# Patient Record
Sex: Female | Born: 1970 | Race: Black or African American | Hispanic: No | Marital: Single | Smoking: Never smoker
Health system: Southern US, Community
[De-identification: ages and names within clinical notes are randomized; demographics above are authoritative.]

## PROBLEM LIST (undated history)

## (undated) DIAGNOSIS — T7840XA Allergy, unspecified, initial encounter: Secondary | ICD-10-CM

## (undated) DIAGNOSIS — F329 Major depressive disorder, single episode, unspecified: Secondary | ICD-10-CM

## (undated) DIAGNOSIS — F419 Anxiety disorder, unspecified: Secondary | ICD-10-CM

## (undated) DIAGNOSIS — E7800 Pure hypercholesterolemia, unspecified: Secondary | ICD-10-CM

## (undated) DIAGNOSIS — F32A Depression, unspecified: Secondary | ICD-10-CM

## (undated) DIAGNOSIS — M797 Fibromyalgia: Secondary | ICD-10-CM

## (undated) DIAGNOSIS — I10 Essential (primary) hypertension: Secondary | ICD-10-CM

## (undated) DIAGNOSIS — F410 Panic disorder [episodic paroxysmal anxiety] without agoraphobia: Secondary | ICD-10-CM

## (undated) HISTORY — DX: Depression, unspecified: F32.A

## (undated) HISTORY — DX: Allergy, unspecified, initial encounter: T78.40XA

## (undated) HISTORY — DX: Major depressive disorder, single episode, unspecified: F32.9

## (undated) HISTORY — PX: DENTAL SURGERY: SHX609

## (undated) HISTORY — PX: ABDOMINAL SURGERY: SHX537

## (undated) HISTORY — PX: OTHER SURGICAL HISTORY: SHX169

---

## 2015-09-28 ENCOUNTER — Emergency Department (HOSPITAL_COMMUNITY): Payer: BLUE CROSS/BLUE SHIELD

## 2015-09-28 ENCOUNTER — Emergency Department (HOSPITAL_COMMUNITY)
Admission: EM | Admit: 2015-09-28 | Discharge: 2015-09-28 | Disposition: A | Discharge status: Home / Self Care | Payer: BLUE CROSS/BLUE SHIELD | Attending: Emergency Medicine | Admitting: Emergency Medicine

## 2015-09-28 ENCOUNTER — Encounter (HOSPITAL_COMMUNITY): Payer: Self-pay | Admitting: Cardiology

## 2015-09-28 DIAGNOSIS — Z79899 Other long term (current) drug therapy: Secondary | ICD-10-CM | POA: Diagnosis not present

## 2015-09-28 DIAGNOSIS — Y9389 Activity, other specified: Secondary | ICD-10-CM | POA: Insufficient documentation

## 2015-09-28 DIAGNOSIS — S3992XA Unspecified injury of lower back, initial encounter: Secondary | ICD-10-CM | POA: Diagnosis present

## 2015-09-28 DIAGNOSIS — Y92410 Unspecified street and highway as the place of occurrence of the external cause: Secondary | ICD-10-CM | POA: Insufficient documentation

## 2015-09-28 DIAGNOSIS — I10 Essential (primary) hypertension: Secondary | ICD-10-CM | POA: Diagnosis not present

## 2015-09-28 DIAGNOSIS — S32020A Wedge compression fracture of second lumbar vertebra, initial encounter for closed fracture: Secondary | ICD-10-CM | POA: Diagnosis not present

## 2015-09-28 DIAGNOSIS — Y999 Unspecified external cause status: Secondary | ICD-10-CM | POA: Diagnosis not present

## 2015-09-28 DIAGNOSIS — S32000A Wedge compression fracture of unspecified lumbar vertebra, initial encounter for closed fracture: Secondary | ICD-10-CM

## 2015-09-28 HISTORY — DX: Fibromyalgia: M79.7

## 2015-09-28 HISTORY — DX: Pure hypercholesterolemia, unspecified: E78.00

## 2015-09-28 HISTORY — DX: Anxiety disorder, unspecified: F41.9

## 2015-09-28 HISTORY — DX: Essential (primary) hypertension: I10

## 2015-09-28 HISTORY — DX: Panic disorder (episodic paroxysmal anxiety): F41.0

## 2015-09-28 LAB — I-STAT BETA HCG BLOOD, ED (MC, WL, AP ONLY)

## 2015-09-28 MED ORDER — KETOROLAC TROMETHAMINE 30 MG/ML IJ SOLN
30.0000 mg | Freq: Once | INTRAMUSCULAR | Status: AC
  Administered 2015-09-28: 30 mg via INTRAMUSCULAR
  Filled 2015-09-28: qty 1

## 2015-09-28 MED ORDER — OXYCODONE-ACETAMINOPHEN 5-325 MG PO TABS: 1.0000 | ORAL_TABLET | ORAL | 0 refills | Status: DC | PRN

## 2015-09-28 MED ORDER — OXYCODONE-ACETAMINOPHEN 5-325 MG PO TABS: 2.0000 | ORAL_TABLET | ORAL | 0 refills | Status: DC | PRN

## 2015-09-28 MED ORDER — DIAZEPAM 5 MG PO TABS
5.0000 mg | ORAL_TABLET | Freq: Once | ORAL | Status: AC
  Administered 2015-09-28: 5 mg via ORAL
  Filled 2015-09-28: qty 1

## 2015-09-28 MED ORDER — MORPHINE SULFATE (PF) 4 MG/ML IV SOLN
4.0000 mg | Freq: Once | INTRAVENOUS | Status: AC
  Administered 2015-09-28: 4 mg via INTRAVENOUS
  Filled 2015-09-28: qty 1

## 2015-09-28 NOTE — ED Notes (Signed)
Back brace placed on pt by bio tech, pt tolerated well, cms remains intact all extremities,

## 2015-09-28 NOTE — ED Notes (Signed)
Biomed tech from [facility] at bedside,

## 2015-09-28 NOTE — ED Notes (Signed)
Pt updated on delay,  
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
## 2015-09-28 NOTE — ED Notes (Signed)
Pt given one pre pack of percocet, qty of 6,
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
## 2015-10-08 MED FILL — Oxycodone w/ Acetaminophen Tab 5-325 MG: ORAL | Qty: 6 | Status: AC

## 2016-02-01 ENCOUNTER — Ambulatory Visit: Payer: BLUE CROSS/BLUE SHIELD | Attending: Neurosurgery

## 2016-02-01 DIAGNOSIS — G8929 Other chronic pain: Secondary | ICD-10-CM | POA: Diagnosis present

## 2016-02-01 DIAGNOSIS — M545 Low back pain, unspecified: Secondary | ICD-10-CM

## 2016-02-01 NOTE — Patient Instructions (Signed)
   You can perform this exercise in any position.   Gently pull your belly button in.    Hold for 5 seconds.    Perform throughout the day (about 2 sets of 10 every 2 hours)
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
## 2016-02-07 ENCOUNTER — Ambulatory Visit: Payer: BLUE CROSS/BLUE SHIELD

## 2016-02-07 DIAGNOSIS — G8929 Other chronic pain: Secondary | ICD-10-CM

## 2016-02-07 DIAGNOSIS — M545 Low back pain: Principal | ICD-10-CM
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
## 2016-02-14 ENCOUNTER — Ambulatory Visit: Payer: BLUE CROSS/BLUE SHIELD

## 2016-02-14 DIAGNOSIS — M545 Low back pain, unspecified: Secondary | ICD-10-CM

## 2016-02-14 DIAGNOSIS — G8929 Other chronic pain: Secondary | ICD-10-CM
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
## 2016-02-16 ENCOUNTER — Ambulatory Visit: Payer: BLUE CROSS/BLUE SHIELD

## 2016-02-16 DIAGNOSIS — G8929 Other chronic pain: Secondary | ICD-10-CM

## 2016-02-16 DIAGNOSIS — M545 Low back pain: Secondary | ICD-10-CM | POA: Diagnosis not present

## 2016-02-16 NOTE — Patient Instructions (Signed)
Gave supine hip fallouts 10x3 with 5 seconds for 3x/day 5 days a week (starting off with 5x2 daily) as part of her HEP. Pt demonstrated and verbalized understanding. Handout provided.
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
## 2016-03-01 ENCOUNTER — Ambulatory Visit: Payer: BLUE CROSS/BLUE SHIELD

## 2016-03-02 ENCOUNTER — Ambulatory Visit: Payer: BLUE CROSS/BLUE SHIELD

## 2016-03-06 ENCOUNTER — Ambulatory Visit: Payer: BLUE CROSS/BLUE SHIELD | Attending: Neurosurgery

## 2016-03-06 DIAGNOSIS — G8929 Other chronic pain: Secondary | ICD-10-CM | POA: Diagnosis present

## 2016-03-06 DIAGNOSIS — M545 Low back pain: Secondary | ICD-10-CM | POA: Insufficient documentation
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
## 2016-03-08 ENCOUNTER — Ambulatory Visit: Payer: BLUE CROSS/BLUE SHIELD

## 2016-03-08 DIAGNOSIS — M545 Low back pain: Secondary | ICD-10-CM | POA: Diagnosis not present

## 2016-03-08 DIAGNOSIS — G8929 Other chronic pain: Secondary | ICD-10-CM

## 2016-03-08 NOTE — Patient Instructions (Signed)
Strengthening: Resisted Extension    Tighten abdomen and pelvic floor muscles.   Hold yellow band, one in each hand, arms forward. Pull arms back, elbow straight. Hold for 5 seconds Repeat ___10_ times per set. Do _3___ sets per session. Do _1__ sessions per day.  http://orth.exer.us/832   Copyright  V[employer]. All rights reserved.
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
## 2016-03-13 ENCOUNTER — Ambulatory Visit: Payer: BLUE CROSS/BLUE SHIELD

## 2016-03-14 ENCOUNTER — Ambulatory Visit: Payer: BLUE CROSS/BLUE SHIELD

## 2016-03-14 DIAGNOSIS — G8929 Other chronic pain: Secondary | ICD-10-CM

## 2016-03-14 DIAGNOSIS — M545 Low back pain: Secondary | ICD-10-CM | POA: Diagnosis not present

## 2016-03-14 NOTE — Patient Instructions (Addendum)
Gave seated L hip extension with L foot propped on a 3-4 inch step 10x3 with 5 second holds daily as part of her HEP. Pt demonstrated and verbalized understanding. Handout provided.
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
## 2016-03-16 ENCOUNTER — Ambulatory Visit: Payer: BLUE CROSS/BLUE SHIELD

## 2016-03-21 ENCOUNTER — Ambulatory Visit: Payer: BLUE CROSS/BLUE SHIELD

## 2016-03-23 ENCOUNTER — Ambulatory Visit: Payer: BLUE CROSS/BLUE SHIELD

## 2016-03-23 DIAGNOSIS — G8929 Other chronic pain: Secondary | ICD-10-CM

## 2016-03-23 DIAGNOSIS — M545 Low back pain: Principal | ICD-10-CM

## 2016-03-23 NOTE — Patient Instructions (Addendum)
Flexion: Isometric (Sitting)    Sitting on a chair     Place your right hand on your right knee   Lift your right knee up (but do not let it move with your right hand)     Hold for 5 seconds.     Repeat 10 times     Do 3 sets daily.         Scapular Retraction: Rowing (Eccentric) - Arms - 45 Degrees (Resistance Band)   Perform in sitting  Hold end of red band in each hand. Pull back until elbows are even with trunk.  Squeeze your shoulder blades together.   Hold for 5 seconds.  Use ______red__ resistance band. _10__ reps per set, _3__ sets per day. Copyright  [employer]. All rights reserved.
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
## 2016-03-28 ENCOUNTER — Ambulatory Visit: Payer: BLUE CROSS/BLUE SHIELD

## 2016-03-30 ENCOUNTER — Ambulatory Visit: Payer: BLUE CROSS/BLUE SHIELD | Attending: Neurosurgery

## 2016-03-30 DIAGNOSIS — G8929 Other chronic pain: Secondary | ICD-10-CM | POA: Diagnosis present

## 2016-03-30 DIAGNOSIS — M545 Low back pain: Secondary | ICD-10-CM | POA: Insufficient documentation
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
## 2016-04-04 ENCOUNTER — Ambulatory Visit: Payer: BLUE CROSS/BLUE SHIELD

## 2016-04-04 DIAGNOSIS — M545 Low back pain, unspecified: Secondary | ICD-10-CM

## 2016-04-04 DIAGNOSIS — G8929 Other chronic pain: Secondary | ICD-10-CM
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
## 2016-04-04 NOTE — Patient Instructions (Addendum)
  Sitting on a chair: raise your right knee (only) up.    Repeat 10 times.   Perform 3 sets daily.         Holding onto something sturdy for support:   Step up onto the first stair step with your right foot.     You are going to end up on top of the first step   Step off with the left foot.    Repeat 10 times.    Perform 3 sets daily.

## 2016-04-06 ENCOUNTER — Ambulatory Visit: Payer: BLUE CROSS/BLUE SHIELD

## 2016-04-06 DIAGNOSIS — G8929 Other chronic pain: Secondary | ICD-10-CM

## 2016-04-06 DIAGNOSIS — M545 Low back pain: Secondary | ICD-10-CM | POA: Diagnosis not present

## 2016-04-06 NOTE — Patient Instructions (Addendum)
  Scapular Retraction: Rowing (Eccentric) - Arms - 45 Degrees (Resistance Band)   Hold end of band in each hand. Pull back until elbows are even with trunk.  Hold for 5 seconds.   Use _yellow or red_______ resistance band. _10__ reps per set, _3__ sets per day. Copyright  VHI. All rights reserved.        Pt was recommended to hold off on the seated R hip flexion isometrics HEP. Pt demonstrated and verbalized understanding.
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
## 2016-04-11 ENCOUNTER — Ambulatory Visit: Payer: BLUE CROSS/BLUE SHIELD

## 2016-04-13 ENCOUNTER — Ambulatory Visit: Payer: BLUE CROSS/BLUE SHIELD

## 2016-04-13 DIAGNOSIS — M545 Low back pain: Principal | ICD-10-CM

## 2016-04-13 DIAGNOSIS — G8929 Other chronic pain: Secondary | ICD-10-CM

## 2016-04-13 NOTE — Patient Instructions (Signed)
Upper Cervical Flexion / Extension    Gently flex and extend upper neck by nodding head. Try to make a "long neck" or a "double chin." Hold ___5_ seconds. Repeat _10___ times per set. Do 3____ sets per session. Do ___1_ sessions per day.   Can also do this exercise while working.   http://orth.exer.us/351   Copyright  VHI. All rights reserved.
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
## 2016-04-18 ENCOUNTER — Ambulatory Visit: Payer: BLUE CROSS/BLUE SHIELD

## 2016-04-18 DIAGNOSIS — G8929 Other chronic pain: Secondary | ICD-10-CM

## 2016-04-18 DIAGNOSIS — M545 Low back pain, unspecified: Secondary | ICD-10-CM
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
## 2016-04-20 ENCOUNTER — Ambulatory Visit: Payer: BLUE CROSS/BLUE SHIELD

## 2016-04-20 DIAGNOSIS — G8929 Other chronic pain: Secondary | ICD-10-CM

## 2016-04-20 DIAGNOSIS — M545 Low back pain: Principal | ICD-10-CM

## 2016-04-20 NOTE — Patient Instructions (Signed)
  Walk side ways from one end of your hallway to the other.    Perform until tired daily.
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
## 2016-09-17 ENCOUNTER — Emergency Department: Payer: BLUE CROSS/BLUE SHIELD

## 2016-09-17 ENCOUNTER — Encounter: Payer: Self-pay | Admitting: Emergency Medicine

## 2016-09-17 DIAGNOSIS — E876 Hypokalemia: Secondary | ICD-10-CM | POA: Insufficient documentation

## 2016-09-17 DIAGNOSIS — R002 Palpitations: Secondary | ICD-10-CM | POA: Insufficient documentation

## 2016-09-17 DIAGNOSIS — D509 Iron deficiency anemia, unspecified: Secondary | ICD-10-CM | POA: Insufficient documentation

## 2016-09-17 DIAGNOSIS — I10 Essential (primary) hypertension: Secondary | ICD-10-CM | POA: Diagnosis not present

## 2016-09-17 DIAGNOSIS — Z79899 Other long term (current) drug therapy: Secondary | ICD-10-CM | POA: Insufficient documentation

## 2016-09-17 DIAGNOSIS — R42 Dizziness and giddiness: Secondary | ICD-10-CM | POA: Diagnosis present

## 2016-09-17 LAB — BASIC METABOLIC PANEL WITH GFR
Anion gap: 8 (ref 5–15)
BUN: 9 mg/dL (ref 6–20)
CO2: 29 mmol/L (ref 22–32)
Calcium: 9 mg/dL (ref 8.9–10.3)
Chloride: 100 mmol/L — ABNORMAL LOW (ref 101–111)
Creatinine, Ser: 0.77 mg/dL (ref 0.44–1.00)
GFR calc Af Amer: >60 mL/min
GFR calc non Af Amer: >60 mL/min
Glucose, Bld: 118 mg/dL — ABNORMAL HIGH (ref 65–99)
Potassium: 2.9 mmol/L — ABNORMAL LOW (ref 3.5–5.1)
Sodium: 137 mmol/L (ref 135–145)

## 2016-09-17 LAB — CBC
HCT: 26 % — ABNORMAL LOW (ref 35.0–47.0)
HEMOGLOBIN: 8.3 g/dL — AB (ref 12.0–16.0)
MCH: 19.1 pg — AB (ref 26.0–34.0)
MCHC: 32 g/dL (ref 32.0–36.0)
MCV: 59.8 fL — AB (ref 80.0–100.0)
PLATELETS: 403 10*3/uL (ref 150–440)
RBC: 4.34 MIL/uL (ref 3.80–5.20)
RDW: 20.8 % — ABNORMAL HIGH (ref 11.5–14.5)
WBC: 6.1 10*3/uL (ref 3.6–11.0)

## 2016-09-17 NOTE — ED Triage Notes (Signed)
Pt reports for several months intermittent palpitations with burning sensation in her chest; last episode was "the other day"; pt also reports intermittent throbbing sensation to various places in her head, last episode was "sometime this week"; pt also c/o feeling lightheaded intermittently, last episode was "this week"; pt also c/o numbness to her right elbow and numbness to bilateral hands when she wakes "every morning"; denies cough, fever, weakness; pt with flat affect in triage; answering questions in complete coherent sentences

## 2016-09-18 ENCOUNTER — Emergency Department
Admission: EM | Admit: 2016-09-18 | Discharge: 2016-09-18 | Disposition: A | Discharge status: Home / Self Care | Payer: BLUE CROSS/BLUE SHIELD | Attending: Emergency Medicine | Admitting: Emergency Medicine

## 2016-09-18 DIAGNOSIS — E876 Hypokalemia: Secondary | ICD-10-CM

## 2016-09-18 DIAGNOSIS — D509 Iron deficiency anemia, unspecified: Secondary | ICD-10-CM

## 2016-09-18 LAB — TROPONIN I: Troponin I: <0.03 ng/mL (ref <0.03)

## 2016-09-18 MED ORDER — FERROUS SULFATE 325 (65 FE) MG PO TABS: 325.0000 mg | ORAL_TABLET | Freq: Every day | ORAL | 3 refills | Status: AC

## 2016-09-18 MED ORDER — POTASSIUM CHLORIDE 20 MEQ PO PACK
40.0000 meq | PACK | Freq: Once | ORAL | Status: AC
  Administered 2016-09-18: 40 meq via ORAL
  Filled 2016-09-18: qty 2

## 2016-09-18 NOTE — ED Provider Notes (Signed)
[facility] Emergency Department Provider Note    First MD Initiated Contact with Patient 09/18/16 0206     (approximate)  I have reviewed the triage vital signs and the nursing notes.   HISTORY  Chief Complaint Palpitations and Dizziness    HPI Stacie Stewart is a 46 y.o. female with low-dose of chronic medical conditions presents to emergency department with history of heart palpitations times "couple years", fatigue, intermittent lightheadedness. Patient denies any headache no nausea vomiting. Patient denies any abdominal pain chest pain or shortness of breath. Patient denies any fever. Patient does however admit to heavy menstrual periods. No previous history of anemia or thyroid disease.   Past Medical History:  Diagnosis Date  . Allergy   . Anxiety   . Depression   . Fibromyalgia   . Hypercholesteremia   . Hypertension   . Panic attacks     There are no active problems to display for this patient.   Past Surgical History:  Procedure Laterality Date  . ABDOMINAL SURGERY    . DENTAL SURGERY    . thyroid     pt has not had thyroid surgery    Prior to Admission medications   Medication Sig Start Date End Date Taking? Authorizing Provider  cetirizine (ZYRTEC) 10 MG tablet Take 10 mg by mouth daily as needed for allergies.   Yes [provider]  cholecalciferol (VITAMIN D) 1000 units tablet Take 1,000 Units by mouth daily.   Yes [provider]  cyanocobalamin 500 MCG tablet Take 500 mcg by mouth daily.   Yes [provider]  diltiazem (CARDIZEM) 120 MG tablet Take 120 mg by mouth daily.   Yes [provider]  DULoxetine (CYMBALTA) 30 MG capsule Take by mouth daily.    Yes [provider]  Omega-3 Fatty Acids (FISH OIL) 1000 MG CAPS Take 1 capsule by mouth daily.   Yes [provider]  triamterene-hydrochlorothiazide (MAXZIDE-25) 37.5-25 MG tablet Take 1 tablet by mouth daily.   Yes  [provider]  ferrous sulfate 325 (65 FE) MG tablet Take 1 tablet (325 mg total) by mouth daily. 09/18/16 09/18/17  Gregor Hams, MD    Allergies No known drug allergies History reviewed. No pertinent family history.  Social History Social History  Substance Use Topics  . Smoking status: Never Smoker  . Smokeless tobacco: Never Used  . Alcohol use No    Review of Systems Constitutional: No fever/chills Eyes: No visual changes. ENT: No sore throat. Cardiovascular: Denies chest pain. Positive palpitation Respiratory: Denies shortness of breath. Gastrointestinal: No abdominal pain.  No nausea, no vomiting.  No diarrhea.  No constipation. Genitourinary: Negative for dysuria. Musculoskeletal: Negative for neck pain.  Negative for back pain. Integumentary: Negative for rash. Neurological: Negative for headaches, focal weakness or numbness. Positive for fatigue   ____________________________________________   PHYSICAL EXAM:  VITAL SIGNS: ED Triage Vitals  Enc Vitals Group     BP 09/17/16 2319 (!) 144/91     Pulse Rate 09/17/16 2319 (!) 103     Resp 09/17/16 2319 18     Temp 09/17/16 2319 99.7 F (37.6 C)     Temp Source 09/17/16 2319 Oral     SpO2 09/17/16 2319 100 %     Weight 09/17/16 2320 81.6 kg (180 lb)     Height 09/17/16 2320 1.6 m (5\' 3" )     Head Circumference --      Peak Flow --  Pain Score 09/17/16 2321 0     Pain Loc --      Pain Edu? --      Excl. in [place]? --     Constitutional: Alert and oriented. Well appearing and in no acute distress. Eyes: Conjunctivae are Pale Head: Atraumatic. Mouth/Throat: Mucous membranes are moist. Oropharynx non-erythematous. Neck: No stridor.  Cardiovascular: Normal rate, regular rhythm. Good peripheral circulation. Grossly normal heart sounds. Respiratory: Normal respiratory effort.  No retractions. Lungs CTAB. Gastrointestinal: Soft and nontender. No distention.  Musculoskeletal: No lower extremity  tenderness nor edema. No gross deformities of extremities. Neurologic:  Normal speech and language. No gross focal neurologic deficits are appreciated.  Skin:  Skin is warm, dry and intact. No rash noted. Psychiatric: Mood and affect are normal. Speech and behavior are normal.  ____________________________________________   LABS (all labs ordered are listed, but only abnormal results are displayed)  Labs Reviewed  BASIC METABOLIC PANEL - Abnormal; Notable for the following:       Result Value   Potassium 2.9 (*)    Chloride 100 (*)    Glucose, Bld 118 (*)    All other components within normal limits  CBC - Abnormal; Notable for the following:    Hemoglobin 8.3 (*)    HCT 26.0 (*)    MCV 59.8 (*)    MCH 19.1 (*)    RDW 20.8 (*)    All other components within normal limits  TROPONIN I  URINALYSIS, COMPLETE (UACMP) WITH MICROSCOPIC    RADIOLOGY I, Tracy N Amijah Timothy, personally viewed and evaluated these images (plain radiographs) as part of my medical decision making, as well as reviewing the written report by the radiologist.  Dg Chest 2 View  Result Date: 09/18/2016 CLINICAL DATA:  Palpitations EXAM: CHEST  2 VIEW COMPARISON:  None. FINDINGS: The heart size and mediastinal contours are within normal limits. Both lungs are clear. The visualized skeletal structures are unremarkable. IMPRESSION: No active cardiopulmonary disease. Electronically Signed   By: Ulyses Jarred M.D.   On: 09/18/2016 00:35   ED ECG REPORT I,  N Kiyah Demartini, the attending physician, personally viewed and interpreted this ECG.   Date: 09/18/2016  EKG Time: 11:23 PM  Rate: 89  Rhythm: Normal sinus rhythm  Axis: Normal  Intervals: Normal  ST&T Change: None  Procedures   ____________________________________________   INITIAL IMPRESSION / ASSESSMENT AND PLAN / ED COURSE  Pertinent labs & imaging results that were available during my care of the patient were reviewed by me and considered in my  medical decision making (see chart for details).  46 year old female presenting with fatigue, intermittent heart palpitations and lightheadedness. Clinical exam unremarkable with the exception of pale conjunctiva. Laboratory data revealed hemoglobin of 8.3 with a hematocrit of 26 in addition patient's potassium noted to be 2.9. Patient received 40 mEq of oral potassium. Patient will be prescribed iron is a patient MCV is 59.8 . Patient is advised to follow-up with primary care provider.      ____________________________________________  FINAL CLINICAL IMPRESSION(S) / ED DIAGNOSES  Final diagnoses:  Hypokalemia  Iron deficiency anemia, unspecified iron deficiency anemia type     MEDICATIONS GIVEN DURING THIS VISIT:  Medications - No data to display   NEW OUTPATIENT MEDICATIONS STARTED DURING THIS VISIT:  New Prescriptions   FERROUS SULFATE 325 (65 FE) MG TABLET    Take 1 tablet (325 mg total) by mouth daily.    Modified Medications   No medications on file  Discontinued Medications   OXYCODONE-ACETAMINOPHEN (PERCOCET/ROXICET) 5-325 MG TABLET    Take 2 tablets by mouth every 4 (four) hours as needed for severe pain.   OXYCODONE-ACETAMINOPHEN (PERCOCET/ROXICET) 5-325 MG TABLET    Take 1 tablet by mouth every 4 (four) hours as needed for severe pain.     Note:  This document was prepared using Dragon voice recognition software and may include unintentional dictation errors.    Gregor Hams, MD 09/18/16 [phone]

## 2017-03-19 ENCOUNTER — Encounter: Payer: Self-pay | Admitting: Emergency Medicine

## 2017-03-19 ENCOUNTER — Emergency Department
Admission: EM | Admit: 2017-03-19 | Discharge: 2017-03-19 | Disposition: A | Discharge status: Home / Self Care | Payer: BLUE CROSS/BLUE SHIELD | Attending: Emergency Medicine | Admitting: Emergency Medicine

## 2017-03-19 ENCOUNTER — Other Ambulatory Visit: Payer: Self-pay

## 2017-03-19 DIAGNOSIS — I10 Essential (primary) hypertension: Secondary | ICD-10-CM | POA: Insufficient documentation

## 2017-03-19 DIAGNOSIS — M545 Low back pain, unspecified: Secondary | ICD-10-CM

## 2017-03-19 DIAGNOSIS — J302 Other seasonal allergic rhinitis: Secondary | ICD-10-CM | POA: Insufficient documentation

## 2017-03-19 DIAGNOSIS — Z79899 Other long term (current) drug therapy: Secondary | ICD-10-CM | POA: Insufficient documentation

## 2017-03-19 DIAGNOSIS — Z760 Encounter for issue of repeat prescription: Secondary | ICD-10-CM

## 2017-03-19 LAB — COMPREHENSIVE METABOLIC PANEL
ALBUMIN: 3.7 g/dL (ref 3.5–5.0)
ALT: 21 U/L (ref 14–54)
AST: 27 U/L (ref 15–41)
Alkaline Phosphatase: 44 U/L (ref 38–126)
Anion gap: 9 (ref 5–15)
BUN: 7 mg/dL (ref 6–20)
CO2: 24 mmol/L (ref 22–32)
CREATININE: 0.65 mg/dL (ref 0.44–1.00)
Calcium: 8.6 mg/dL — ABNORMAL LOW (ref 8.9–10.3)
Chloride: 106 mmol/L (ref 101–111)
GFR calc Af Amer: >60 mL/min (ref >60)
GLUCOSE: 100 mg/dL — AB (ref 65–99)
Potassium: 3.8 mmol/L (ref 3.5–5.1)
SODIUM: 139 mmol/L (ref 135–145)
Total Bilirubin: 0.5 mg/dL (ref 0.3–1.2)
Total Protein: 7 g/dL (ref 6.5–8.1)

## 2017-03-19 LAB — CBC
HCT: 34.4 % — ABNORMAL LOW (ref 35.0–47.0)
Hemoglobin: 11.2 g/dL — ABNORMAL LOW (ref 12.0–16.0)
MCH: 25.7 pg — ABNORMAL LOW (ref 26.0–34.0)
MCHC: 32.7 g/dL (ref 32.0–36.0)
MCV: 78.6 fL — AB (ref 80.0–100.0)
PLATELETS: 246 10*3/uL (ref 150–440)
RBC: 4.37 MIL/uL (ref 3.80–5.20)
RDW: 16.2 % — ABNORMAL HIGH (ref 11.5–14.5)
WBC: 5.6 10*3/uL (ref 3.6–11.0)

## 2017-03-19 LAB — URINALYSIS, COMPLETE (UACMP) WITH MICROSCOPIC
BILIRUBIN URINE: NEGATIVE
Bacteria, UA: NONE SEEN
GLUCOSE, UA: NEGATIVE mg/dL
HGB URINE DIPSTICK: NEGATIVE
Ketones, ur: NEGATIVE mg/dL
LEUKOCYTES UA: NEGATIVE
Nitrite: NEGATIVE
PH: 7 (ref 5.0–8.0)
Protein, ur: NEGATIVE mg/dL
RBC / HPF: NONE SEEN RBC/hpf (ref 0–5)
SPECIFIC GRAVITY, URINE: 1.012 (ref 1.005–1.030)

## 2017-03-19 LAB — POCT PREGNANCY, URINE: Preg Test, Ur: NEGATIVE

## 2017-03-19 MED ORDER — CYCLOBENZAPRINE HCL 5 MG PO TABS: ORAL_TABLET | ORAL | 0 refills | Status: AC

## 2017-03-19 MED ORDER — IBUPROFEN 600 MG PO TABS: 600.0000 mg | ORAL_TABLET | Freq: Four times a day (QID) | ORAL | 0 refills | Status: DC | PRN

## 2017-03-19 MED ORDER — DILTIAZEM HCL 120 MG PO TABS
120.0000 mg | ORAL_TABLET | Freq: Every day | ORAL | 0 refills | Status: AC
Start: 2017-03-19 — End: ?

## 2017-03-19 MED ORDER — CETIRIZINE HCL 10 MG PO TABS: 10.0000 mg | ORAL_TABLET | Freq: Every day | ORAL | 0 refills | Status: AC | PRN

## 2017-03-19 MED ORDER — DULOXETINE HCL 30 MG PO CPEP: 30.0000 mg | ORAL_CAPSULE | Freq: Every day | ORAL | 0 refills | Status: AC

## 2017-03-19 NOTE — ED Notes (Addendum)
See triage note  Presents with back pain for about 3 days   Denies any fall but started a new job  And thinks this is causing the pain  Describes pain as soreness also having generalized swelling    States she has a hx of this in past

## 2017-03-19 NOTE — ED Provider Notes (Signed)
[facility] Emergency Department Provider Note  ____________________________________________  Time seen: Approximately 3:25 PM  I have reviewed the triage vital signs and the nursing notes.   HISTORY  Chief Complaint Back Pain    HPI Stacie Stewart is a 47 y.o. female that presents the emergency department for evaluation of low back pain and " whole body swelling" after starting her new job 3 days ago.  Patient works at [employer] and is on her feet 9 hours/day.  She is cutting strawberries and mangoes.  She states that after work she feels like her face, hands, arms, legs are swelling. Her face and eyes are the worse. She also has rhinorrhea that started after starting her new job. She is also having pain throughout her low back.  No trauma or injury.  She also ran out of her diltiazam, cymbalta, and maxzide last week. She does not currently have a primary care provider because she does not have insurance. She has a history of anemia and takes iron supplements.  No history of thyroid problems or CHF. She denies chest pain, shortness of breath, nausea, vomiting, abdominal pain, bowel or bladder dysfunction, saddle paresthesias, dysuria, urgency, frequency, radicular symptoms.  Past Medical History:  Diagnosis Date  . Allergy   . Anxiety   . Depression   . Fibromyalgia   . Hypercholesteremia   . Hypertension   . Panic attacks     There are no active problems to display for this patient.   Past Surgical History:  Procedure Laterality Date  . ABDOMINAL SURGERY    . DENTAL SURGERY    . thyroid     pt has not had thyroid surgery    Prior to Admission medications   Medication Sig Start Date End Date Taking? Authorizing Provider  cetirizine (ZYRTEC) 10 MG tablet Take 1 tablet (10 mg total) by mouth daily as needed for allergies. 03/19/17   Laban Emperor, PA-C  cholecalciferol (VITAMIN D) 1000 units tablet Take 1,000 Units by mouth daily.    [provider]  cyanocobalamin 500 MCG tablet Take 500 mcg by mouth daily.    [provider]  cyclobenzaprine (FLEXERIL) 5 MG tablet Take 1-2 tablets 3 times daily as needed 03/19/17   Laban Emperor, PA-C  diltiazem (CARDIZEM) 120 MG tablet Take 1 tablet (120 mg total) by mouth daily. 03/19/17   Laban Emperor, PA-C  DULoxetine (CYMBALTA) 30 MG capsule Take 1 capsule (30 mg total) by mouth daily. 03/19/17   Laban Emperor, PA-C  ferrous sulfate 325 (65 FE) MG tablet Take 1 tablet (325 mg total) by mouth daily. 09/18/16 09/18/17  Gregor Hams, MD  ibuprofen (ADVIL,MOTRIN) 600 MG tablet Take 1 tablet (600 mg total) by mouth every 6 (six) hours as needed. 03/19/17   Laban Emperor, PA-C  Omega-3 Fatty Acids (FISH OIL) 1000 MG CAPS Take 1 capsule by mouth daily.    [provider]  triamterene-hydrochlorothiazide (MAXZIDE-25) 37.5-25 MG tablet Take 1 tablet by mouth daily.    [provider]    Allergies Patient has no known allergies.  No family history on file.  Social History Social History   Tobacco Use  . Smoking status: Never Smoker  . Smokeless tobacco: Never Used  Substance Use Topics  . Alcohol use: No  . Drug use: No     Review of Systems  Cardiovascular: No chest pain. Respiratory: No SOB. Gastrointestinal: No abdominal pain.  No nausea, no vomiting.  Musculoskeletal: Positive for low  back pain. Skin: Negative for rash, abrasions, lacerations, ecchymosis. Neurological: Negative for  numbness or tingling   ____________________________________________   PHYSICAL EXAM:  VITAL SIGNS: ED Triage Vitals  Enc Vitals Group     BP 03/19/17 1425 (!) 141/95     Pulse Rate 03/19/17 1425 (!) 105     Resp 03/19/17 1425 20     Temp 03/19/17 1425 98.5 F (36.9 C)     Temp Source 03/19/17 1425 Oral     SpO2 03/19/17 1425 99 %     Weight 03/19/17 1426 185 lb (83.9 kg)     Height 03/19/17 1426 5\' 3"  (1.6 m)     Head Circumference --      Peak Flow  --      Pain Score 03/19/17 1425 2     Pain Loc --      Pain Edu? --      Excl. in [place]? --      Constitutional: Alert and oriented. Well appearing and in no acute distress. Eyes: Conjunctivae are normal. PERRL. EOMI. Head: Atraumatic. ENT:      Ears:      Nose: No congestion/rhinnorhea.      Mouth/Throat: Mucous membranes are moist.  Neck: No stridor.  Cardiovascular: Normal rate, regular rhythm.  Good peripheral circulation. Respiratory: Normal respiratory effort without tachypnea or retractions. Lungs CTAB. Good air entry to the bases with no decreased or absent breath sounds. Gastrointestinal: Bowel sounds 4 quadrants. Soft and nontender to palpation. No guarding or rigidity. No palpable masses. No distention.  Musculoskeletal: Full range of motion to all extremities. No gross deformities appreciated. Tenderness to palpation throughout low back. No pinpoint tenderness to lumbar spine. No edema to hands or legs. Normal gait. Neurologic:  Normal speech and language. No gross focal neurologic deficits are appreciated.  Skin:  Skin is warm, dry and intact. No rash noted.   ____________________________________________   LABS (all labs ordered are listed, but only abnormal results are displayed)  Labs Reviewed  CBC - Abnormal; Notable for the following components:      Result Value   Hemoglobin 11.2 (*)    HCT 34.4 (*)    MCV 78.6 (*)    MCH 25.7 (*)    RDW 16.2 (*)    All other components within normal limits  COMPREHENSIVE METABOLIC PANEL - Abnormal; Notable for the following components:   Glucose, Bld 100 (*)    Calcium 8.6 (*)    All other components within normal limits  URINALYSIS, COMPLETE (UACMP) WITH MICROSCOPIC - Abnormal; Notable for the following components:   Color, Urine YELLOW (*)    APPearance CLEAR (*)    Squamous Epithelial / LPF 0-5 (*)    All other components within normal limits  POC URINE PREG, ED  POCT PREGNANCY, URINE    ____________________________________________  EKG   ____________________________________________  RADIOLOGY  No results found.  ____________________________________________    PROCEDURES  Procedure(s) performed:    Procedures    Medications - No data to display   ____________________________________________   INITIAL IMPRESSION / ASSESSMENT AND PLAN / ED COURSE  Pertinent labs & imaging results that were available during my care of the patient were reviewed by me and considered in my medical decision making (see chart for details).  Review of the [place] CSRS was performed in accordance of the [place] prior to dispensing any controlled drugs.  Patient presented to the emergency department for evaluation of low back pain and "swelling" after starting a  new job 3 days ago.  Patient also has not been taking her blood pressure or anxiety medication for the last week because she does not have any more refills.  [facility] was called and she has one refill left on her Maxide.  She will be given 2 weeks of her diltiazem and Cymbalta.  Rhinorrhea and eye and facial puffiness are consistent with allergies.  Lab work is reassuring.  She denies chest pain or shortness of breath.  No infection on urinalysis.  Patient will be discharged home with prescriptions for ibuprofen, Flexeril, cetirizine, Cymbalta, diltiazem. Patient is to follow up with PCP as directed. Resources were provided. Patient is given ED precautions to return to the ED for any worsening or new symptoms.     ____________________________________________  FINAL CLINICAL IMPRESSION(S) / ED DIAGNOSES  Final diagnoses:  Medication refill  Seasonal allergies  Acute bilateral low back pain without sciatica      NEW MEDICATIONS STARTED DURING THIS VISIT:  ED Discharge Orders        Ordered    cetirizine (ZYRTEC) 10 MG tablet  Daily PRN     03/19/17 1711    diltiazem (CARDIZEM) 120 MG tablet  Daily     03/19/17  1711    DULoxetine (CYMBALTA) 30 MG capsule  Daily     03/19/17 1711    ibuprofen (ADVIL,MOTRIN) 600 MG tablet  Every 6 hours PRN     03/19/17 1711    cyclobenzaprine (FLEXERIL) 5 MG tablet     03/19/17 1711          This chart was dictated using voice recognition software/Dragon. Despite best efforts to proofread, errors can occur which can change the meaning. Any change was purely unintentional.    Laban Emperor, PA-C 03/19/17 2348    Arta Silence, MD 03/26/17 [phone]

## 2017-03-19 NOTE — ED Triage Notes (Signed)
Increased back pain x 3 days since started new job.

## 2017-03-29 ENCOUNTER — Emergency Department
Admission: EM | Admit: 2017-03-29 | Discharge: 2017-03-29 | Disposition: A | Discharge status: Home / Self Care | Payer: Self-pay | Attending: Emergency Medicine | Admitting: Emergency Medicine

## 2017-03-29 ENCOUNTER — Other Ambulatory Visit: Payer: Self-pay

## 2017-03-29 ENCOUNTER — Emergency Department: Payer: Self-pay

## 2017-03-29 DIAGNOSIS — Z79899 Other long term (current) drug therapy: Secondary | ICD-10-CM | POA: Insufficient documentation

## 2017-03-29 DIAGNOSIS — I10 Essential (primary) hypertension: Secondary | ICD-10-CM | POA: Insufficient documentation

## 2017-03-29 DIAGNOSIS — J111 Influenza due to unidentified influenza virus with other respiratory manifestations: Secondary | ICD-10-CM | POA: Insufficient documentation

## 2017-03-29 LAB — CBC WITH DIFFERENTIAL/PLATELET
Basophils Absolute: 0 10*3/uL (ref 0–0.1)
Basophils Relative: 1 %
EOS ABS: 0 10*3/uL (ref 0–0.7)
Eosinophils Relative: 1 %
HEMATOCRIT: 34.8 % — AB (ref 35.0–47.0)
HEMOGLOBIN: 11.8 g/dL — AB (ref 12.0–16.0)
LYMPHS ABS: 0.4 10*3/uL — AB (ref 1.0–3.6)
Lymphocytes Relative: 7 %
MCH: 25.9 pg — ABNORMAL LOW (ref 26.0–34.0)
MCHC: 33.8 g/dL (ref 32.0–36.0)
MCV: 76.4 fL — ABNORMAL LOW (ref 80.0–100.0)
MONO ABS: 0.9 10*3/uL (ref 0.2–0.9)
MONOS PCT: 18 %
NEUTROS ABS: 3.7 10*3/uL (ref 1.4–6.5)
NEUTROS PCT: 73 %
Platelets: 267 10*3/uL (ref 150–440)
RBC: 4.55 MIL/uL (ref 3.80–5.20)
RDW: 15.5 % — AB (ref 11.5–14.5)
WBC: 5 10*3/uL (ref 3.6–11.0)

## 2017-03-29 LAB — BASIC METABOLIC PANEL
Anion gap: 7 (ref 5–15)
BUN: 9 mg/dL (ref 6–20)
CALCIUM: 8.8 mg/dL — AB (ref 8.9–10.3)
CHLORIDE: 99 mmol/L — AB (ref 101–111)
CO2: 28 mmol/L (ref 22–32)
CREATININE: 0.85 mg/dL (ref 0.44–1.00)
GFR calc non Af Amer: >60 mL/min (ref >60)
GLUCOSE: 104 mg/dL — AB (ref 65–99)
Potassium: 3.3 mmol/L — ABNORMAL LOW (ref 3.5–5.1)
Sodium: 134 mmol/L — ABNORMAL LOW (ref 135–145)

## 2017-03-29 LAB — INFLUENZA PANEL BY PCR (TYPE A & B)
INFLAPCR: POSITIVE — AB
Influenza B By PCR: NEGATIVE

## 2017-03-29 MED ORDER — OSELTAMIVIR PHOSPHATE 75 MG PO CAPS: 75.0000 mg | ORAL_CAPSULE | Freq: Two times a day (BID) | ORAL | 0 refills | Status: AC

## 2017-03-29 MED ORDER — IBUPROFEN 800 MG PO TABS
800.0000 mg | ORAL_TABLET | Freq: Once | ORAL | Status: AC
  Administered 2017-03-29: 800 mg via ORAL

## 2017-03-29 MED ORDER — PSEUDOEPH-BROMPHEN-DM 30-2-10 MG/5ML PO SYRP: 5.0000 mL | ORAL_SOLUTION | Freq: Four times a day (QID) | ORAL | 0 refills | Status: AC | PRN

## 2017-03-29 MED ORDER — SODIUM CHLORIDE 0.9 % IV BOLUS (SEPSIS)
1000.0000 mL | Freq: Once | INTRAVENOUS | Status: AC
  Administered 2017-03-29: 1000 mL via INTRAVENOUS

## 2017-03-29 MED ORDER — IBUPROFEN 600 MG PO TABS: 600.0000 mg | ORAL_TABLET | Freq: Three times a day (TID) | ORAL | 0 refills | Status: DC | PRN

## 2017-03-29 MED ORDER — IBUPROFEN 800 MG PO TABS
ORAL_TABLET | ORAL | Status: AC
  Filled 2017-03-29: qty 1

## 2017-03-29 MED ORDER — KETOROLAC TROMETHAMINE 30 MG/ML IJ SOLN
30.0000 mg | Freq: Once | INTRAMUSCULAR | Status: AC
Start: 2017-03-29 — End: 2017-03-29
  Administered 2017-03-29: 30 mg via INTRAVENOUS
  Filled 2017-03-29: qty 1

## 2017-03-29 NOTE — ED Notes (Signed)
See triage note  Presents with cough body aches and and fever which started yesterday

## 2017-03-29 NOTE — ED Triage Notes (Signed)
Pt c/o cough, fever (max "not taken"), body aches, runny nose, headache

## 2017-03-29 NOTE — ED Provider Notes (Signed)
[facility] Emergency Department Provider Note   ____________________________________________   First MD Initiated Contact with Patient 03/29/17 1853     (approximate)  I have reviewed the triage vital signs and the nursing notes.   HISTORY  Chief Complaint URI    HPI Stacie Stewart is a 47 y.o. female patient complaining acute onset of fever, cough, body aches, runny nose, and headache.  Patient also complained of chest pain secondary to coughing.  Patient denies nausea, vomiting, diarrhea.  Patient rates pain as 8/10.  Patient describes her pain is "generalized aching".  No pulses measured for complaint.  Past Medical History:  Diagnosis Date  . Allergy   . Anxiety   . Depression   . Fibromyalgia   . Hypercholesteremia   . Hypertension   . Panic attacks     There are no active problems to display for this patient.   Past Surgical History:  Procedure Laterality Date  . ABDOMINAL SURGERY    . DENTAL SURGERY    . thyroid     pt has not had thyroid surgery    Prior to Admission medications   Medication Sig Start Date End Date Taking? Authorizing Provider  brompheniramine-pseudoephedrine-DM 30-2-10 MG/5ML syrup Take 5 mLs by mouth 4 (four) times daily as needed. 03/29/17   Sable Feil, PA-C  cetirizine (ZYRTEC) 10 MG tablet Take 1 tablet (10 mg total) by mouth daily as needed for allergies. 03/19/17   Laban Emperor, PA-C  cholecalciferol (VITAMIN D) 1000 units tablet Take 1,000 Units by mouth daily.    [provider]  cyanocobalamin 500 MCG tablet Take 500 mcg by mouth daily.    [provider]  cyclobenzaprine (FLEXERIL) 5 MG tablet Take 1-2 tablets 3 times daily as needed 03/19/17   Laban Emperor, PA-C  diltiazem (CARDIZEM) 120 MG tablet Take 1 tablet (120 mg total) by mouth daily. 03/19/17   Laban Emperor, PA-C  DULoxetine (CYMBALTA) 30 MG capsule Take 1 capsule (30 mg total) by mouth daily. 03/19/17   Laban Emperor,  PA-C  ferrous sulfate 325 (65 FE) MG tablet Take 1 tablet (325 mg total) by mouth daily. 09/18/16 09/18/17  Gregor Hams, MD  ibuprofen (ADVIL,MOTRIN) 600 MG tablet Take 1 tablet (600 mg total) by mouth every 6 (six) hours as needed. 03/19/17   Laban Emperor, PA-C  ibuprofen (ADVIL,MOTRIN) 600 MG tablet Take 1 tablet (600 mg total) by mouth every 8 (eight) hours as needed. 03/29/17   Sable Feil, PA-C  Omega-3 Fatty Acids (FISH OIL) 1000 MG CAPS Take 1 capsule by mouth daily.    [provider]  oseltamivir (TAMIFLU) 75 MG capsule Take 1 capsule (75 mg total) by mouth 2 (two) times daily for 5 days. 03/29/17 04/03/17  Sable Feil, PA-C  triamterene-hydrochlorothiazide (MAXZIDE-25) 37.5-25 MG tablet Take 1 tablet by mouth daily.    [provider]    Allergies Patient has no known allergies.  No family history on file.  Social History Social History   Tobacco Use  . Smoking status: Never Smoker  . Smokeless tobacco: Never Used  Substance Use Topics  . Alcohol use: No  . Drug use: No    Review of Systems Constitutional: Fever/chills, body aches Eyes: No visual changes. ENT: No sore throat. Cardiovascular: Denies chest pain. Respiratory: Denies shortness of breath.  Productive cough Gastrointestinal: No abdominal pain.  No nausea, no vomiting.  No diarrhea.  No constipation. Genitourinary: Negative for dysuria. Musculoskeletal: Negative for  back pain. Skin: Negative for rash. Neurological: Positive for headaches, but denies focal weakness or numbness. Psychiatric:Anxiety, depression, and panic attacks. Endocrine:Hypertension   ____________________________________________   PHYSICAL EXAM:  VITAL SIGNS: ED Triage Vitals  Enc Vitals Group     BP 03/29/17 1836 128/76     Pulse Rate 03/29/17 1836 (!) 136     Resp 03/29/17 1836 18     Temp 03/29/17 1836 (!) 101.4 F (38.6 C)     Temp Source 03/29/17 1836 Oral     SpO2 03/29/17 1836 95 %      Weight 03/29/17 1837 200 lb (90.7 kg)     Height 03/29/17 1837 5\' 3"  (1.6 m)     Head Circumference --      Peak Flow --      Pain Score 03/29/17 1836 8     Pain Loc --      Pain Edu? --      Excl. in [place]? --    Constitutional: Alert and oriented. Well appearing and in no acute distress. Nose: Edematous nasal turbinates with bilateral maxillary guarding.  Mouth/Throat: Mucous membranes are moist.  Oropharynx non-erythematous.  Postnasal drainage. Neck: No stridor. Hematological/Lymphatic/Immunilogical: No cervical lymphadenopathy. Cardiovascular: Tachycardic, regular rhythm. Grossly normal heart sounds.  Good peripheral circulation. Respiratory: Normal respiratory effort.  No retractions.  Lungs with bilateral rhonchi. Gastrointestinal: Soft and nontender. No distention. No abdominal bruits. No CVA tenderness. Musculoskeletal: No lower extremity tenderness nor edema.  No joint effusions. Neurologic:  Normal speech and language. No gross focal neurologic deficits are appreciated. No gait instability. Skin:  Skin is warm, dry and intact. No rash noted. Psychiatric: Mood and affect are normal. Speech and behavior are normal.  ____________________________________________   LABS (all labs ordered are listed, but only abnormal results are displayed)  Labs Reviewed  INFLUENZA PANEL BY PCR (TYPE A & B) - Abnormal; Notable for the following components:      Result Value   Influenza A By PCR POSITIVE (*)    All other components within normal limits  BASIC METABOLIC PANEL - Abnormal; Notable for the following components:   Sodium 134 (*)    Potassium 3.3 (*)    Chloride 99 (*)    Glucose, Bld 104 (*)    Calcium 8.8 (*)    All other components within normal limits  CBC WITH DIFFERENTIAL/PLATELET - Abnormal; Notable for the following components:   Hemoglobin 11.8 (*)    HCT 34.8 (*)    MCV 76.4 (*)    MCH 25.9 (*)    RDW 15.5 (*)    Lymphs Abs 0.4 (*)    All other components within  normal limits   ____________________________________________  EKG   ____________________________________________  RADIOLOGY  ED MD interpretation: X-ray findings consistent with bronchitis Official radiology report(s): Dg Chest 2 View  Result Date: 03/29/2017 CLINICAL DATA:  Cough and body aches. EXAM: CHEST  2 VIEW COMPARISON:  September 17, 2016 FINDINGS: The heart, hila, and mediastinum are unremarkable given low volume technique. Increase lung markings seen bilaterally likely represent bronchitis or atypical infection. No focal infiltrate. IMPRESSION: Probable findings of bronchitis or atypical infection. Electronically Signed   By: Dorise Bullion III M.D   On: 03/29/2017 19:16    ____________________________________________   PROCEDURES  Procedure(s) performed: None  Procedures  Critical Care performed: No  ____________________________________________   INITIAL IMPRESSION / ASSESSMENT AND PLAN / ED COURSE  As part of my medical decision making, I reviewed the following data within  the electronic MEDICAL RECORD NUMBER  Patient presents with acute onset of body aches, frontal headache, runny nose, and cough.  Patient flu test was positive.  Patient checks x-ray consistent with bronchitis.  Discussed lab and x-ray findings with patient.  Patient's pulse and temperature decrease status post IV hydration and ibuprofen.  Patient given discharge care instruction.  Patient advised take medication as directed and given a work note.  Patient [facility] since she does not have a PCP.  Return right ED if condition worsens.        ____________________________________________   FINAL CLINICAL IMPRESSION(S) / ED DIAGNOSES  Final diagnoses:  Influenza     ED Discharge Orders        Ordered    oseltamivir (TAMIFLU) 75 MG capsule  2 times daily     03/29/17 2015    brompheniramine-pseudoephedrine-DM 30-2-10 MG/5ML syrup  4 times daily PRN       03/29/17 2015    ibuprofen (ADVIL,MOTRIN) 600 MG tablet  Every 8 hours PRN     03/29/17 2015       Note:  This document was prepared using Dragon voice recognition software and may include unintentional dictation errors.    Sable Feil, PA-C 03/29/17 2023    Nance Pear, MD 03/30/17 [phone]

## 2017-06-03 ENCOUNTER — Other Ambulatory Visit: Payer: Self-pay

## 2017-06-03 DIAGNOSIS — M25512 Pain in left shoulder: Secondary | ICD-10-CM | POA: Insufficient documentation

## 2017-06-03 DIAGNOSIS — Z79899 Other long term (current) drug therapy: Secondary | ICD-10-CM | POA: Insufficient documentation

## 2017-06-03 DIAGNOSIS — I10 Essential (primary) hypertension: Secondary | ICD-10-CM | POA: Insufficient documentation

## 2017-06-03 DIAGNOSIS — M25531 Pain in right wrist: Secondary | ICD-10-CM | POA: Insufficient documentation

## 2017-06-03 DIAGNOSIS — M25532 Pain in left wrist: Secondary | ICD-10-CM | POA: Insufficient documentation

## 2017-06-03 DIAGNOSIS — G5603 Carpal tunnel syndrome, bilateral upper limbs: Secondary | ICD-10-CM | POA: Insufficient documentation

## 2017-06-03 DIAGNOSIS — M7552 Bursitis of left shoulder: Secondary | ICD-10-CM | POA: Insufficient documentation

## 2017-06-03 NOTE — ED Triage Notes (Signed)
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 Pt also states some pain to her left shoulder and arm. No distress noted

## 2017-06-04 ENCOUNTER — Emergency Department
Admission: EM | Admit: 2017-06-04 | Discharge: 2017-06-04 | Disposition: A | Discharge status: Home / Self Care | Payer: Self-pay | Attending: Emergency Medicine | Admitting: Emergency Medicine

## 2017-06-04 ENCOUNTER — Other Ambulatory Visit: Payer: Self-pay

## 2017-06-04 DIAGNOSIS — M7552 Bursitis of left shoulder: Secondary | ICD-10-CM

## 2017-06-04 DIAGNOSIS — M25531 Pain in right wrist: Secondary | ICD-10-CM

## 2017-06-04 DIAGNOSIS — G5603 Carpal tunnel syndrome, bilateral upper limbs: Secondary | ICD-10-CM

## 2017-06-04 DIAGNOSIS — M25532 Pain in left wrist: Secondary | ICD-10-CM

## 2017-06-04 MED ORDER — NAPROXEN 500 MG PO TABS: 500.0000 mg | ORAL_TABLET | Freq: Two times a day (BID) | ORAL | 0 refills | Status: AC

## 2017-06-04 MED ORDER — KETOROLAC TROMETHAMINE 30 MG/ML IJ SOLN
30.0000 mg | Freq: Once | INTRAMUSCULAR | Status: AC
  Administered 2017-06-04: 30 mg via INTRAMUSCULAR
  Filled 2017-06-04: qty 1

## 2017-06-04 NOTE — ED Notes (Signed)
Wrist splints applied to wrists bilaterally and sling applied to the left arm

## 2017-06-04 NOTE — Discharge Instructions (Signed)
1.  Take anti-inflammatory as needed. 2.  You may remove splints and sling to bathe and shower. 3.  Return to the ER for worsening symptoms, increased swelling, extremity weakness, or other concerns.

## 2017-06-04 NOTE — ED Notes (Addendum)
Pt to the er for numbness swelling to the wrists and hands bilaterally. Pt says it wakes her up from her sleep. Pt is unable to communicate anything to that makes it worse of better. Pt says she works in a cold environment and sometimes her fingers make get numb. Right hand is swollen.

## 2017-06-04 NOTE — ED Provider Notes (Signed)
[facility] Emergency Department Provider Note   ____________________________________________   First MD Initiated Contact with Patient 06/04/17 [phone]     (approximate)  I have reviewed the triage vital signs and the nursing notes.   HISTORY  Chief Complaint Hand Pain and Wrist Pain    HPI Stacie Stewart is a 47 y.o. female who presents to the ED from home with a chief complaint of bilateral wrist pain and left shoulder pain.  Patient is left-hand dominant.  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  Notes several months of bilateral wrist pain as well as left shoulder pain which increased on movement.  Occasionally notes some numbness to the first 3 fingertips of each hand.  Denies extremity weakness.  Denies associated fever, chills, chest pain, shortness of breath, abdominal pain, nausea, vomiting.  Denies recent travel or trauma.   Past Medical History:  Diagnosis Date  . Allergy   . Anxiety   . Depression   . Fibromyalgia   . Hypercholesteremia   . Hypertension   . Panic attacks     There are no active problems to display for this patient.   Past Surgical History:  Procedure Laterality Date  . ABDOMINAL SURGERY    . DENTAL SURGERY    . thyroid     pt has not had thyroid surgery    Prior to Admission medications   Medication Sig Start Date End Date Taking? Authorizing Provider  brompheniramine-pseudoephedrine-DM 30-2-10 MG/5ML syrup Take 5 mLs by mouth 4 (four) times daily as needed. 03/29/17   Sable Feil, PA-C  cetirizine (ZYRTEC) 10 MG tablet Take 1 tablet (10 mg total) by mouth daily as needed for allergies. 03/19/17   Laban Emperor, PA-C  cholecalciferol (VITAMIN D) 1000 units tablet Take 1,000 Units by mouth daily.    [provider]  cyanocobalamin 500 MCG tablet Take 500 mcg by mouth daily.    [provider]  cyclobenzaprine (FLEXERIL) 5 MG tablet Take 1-2 tablets 3 times  daily as needed 03/19/17   Laban Emperor, PA-C  diltiazem (CARDIZEM) 120 MG tablet Take 1 tablet (120 mg total) by mouth daily. 03/19/17   Laban Emperor, PA-C  DULoxetine (CYMBALTA) 30 MG capsule Take 1 capsule (30 mg total) by mouth daily. 03/19/17   Laban Emperor, PA-C  ferrous sulfate 325 (65 FE) MG tablet Take 1 tablet (325 mg total) by mouth daily. 09/18/16 09/18/17  Gregor Hams, MD  ibuprofen (ADVIL,MOTRIN) 600 MG tablet Take 1 tablet (600 mg total) by mouth every 6 (six) hours as needed. 03/19/17   Laban Emperor, PA-C  ibuprofen (ADVIL,MOTRIN) 600 MG tablet Take 1 tablet (600 mg total) by mouth every 8 (eight) hours as needed. 03/29/17   Sable Feil, PA-C  Omega-3 Fatty Acids (FISH OIL) 1000 MG CAPS Take 1 capsule by mouth daily.    [provider]  triamterene-hydrochlorothiazide (MAXZIDE-25) 37.5-25 MG tablet Take 1 tablet by mouth daily.    [provider]    Allergies Patient has no known allergies.  No family history on file.  Social History Social History   Tobacco Use  . Smoking status: Never Smoker  . Smokeless tobacco: Never Used  Substance Use Topics  . Alcohol use: No  . Drug use: No    Review of Systems  Constitutional: No fever/chills. Eyes: No visual changes. ENT: No sore throat. Cardiovascular: Denies chest pain. Respiratory: Denies shortness of breath. Gastrointestinal: No abdominal pain.  No nausea, no vomiting.  No diarrhea.  No constipation. Genitourinary: Negative for dysuria. Musculoskeletal: Positive for left shoulder and bilateral wrist pain. Skin: Negative for rash. Neurological: Negative for headaches, focal weakness or numbness.   ____________________________________________   PHYSICAL EXAM:  VITAL SIGNS: ED Triage Vitals  Enc Vitals Group     BP 06/03/17 2353 (!) 143/83     Pulse Rate 06/03/17 2353 95     Resp 06/03/17 2353 16     Temp 06/03/17 2353 98.3 F (36.8 C)     Temp Source 06/03/17 2353 Oral      SpO2 06/03/17 2353 98 %     Weight 06/03/17 2354 200 lb (90.7 kg)     Height 06/03/17 2354 5\' 3"  (1.6 m)     Head Circumference --      Peak Flow --      Pain Score 06/03/17 2353 4     Pain Loc --      Pain Edu? --      Excl. in [place]? --     Constitutional: Alert and oriented. Well appearing and in no acute distress. Eyes: Conjunctivae are normal. PERRL. EOMI. Head: Atraumatic. Nose: No congestion/rhinnorhea. Mouth/Throat: Mucous membranes are moist.  Oropharynx non-erythematous. Neck: No stridor.  No cervical spine tenderness to palpation. Cardiovascular: Normal rate, regular rhythm. Grossly normal heart sounds.  Good peripheral circulation. Respiratory: Normal respiratory effort.  No retractions. Lungs CTAB. Gastrointestinal: Soft and nontender. No distention. No abdominal bruits. No CVA tenderness. Musculoskeletal:  Bilateral wrists with mild swelling. + Tinel's.  Pain worse on flexion.  2+ radial pulse.  Brisk, less than 5-second capillary refill.  Full grip strength bilaterally.  Left shoulder without deformity.  Full range of motion with some pain. Neurologic:  Normal speech and language. No gross focal neurologic deficits are appreciated. No gait instability. Skin:  Skin is warm, dry and intact. No rash noted. Psychiatric: Mood and affect are normal. Speech and behavior are normal.  ____________________________________________   LABS (all labs ordered are listed, but only abnormal results are displayed)  Labs Reviewed - No data to display ____________________________________________  EKG  None ____________________________________________  RADIOLOGY  ED MD interpretation: None  Official radiology report(s): No results found.  ____________________________________________   PROCEDURES  Procedure(s) performed: None  Procedures  Critical Care performed: No  ____________________________________________   INITIAL IMPRESSION / ASSESSMENT AND PLAN / ED  COURSE  As part of my medical decision making, I reviewed the following data within the [facility] notes reviewed and incorporated and Notes from prior ED visits   47 year old female who presents with bilateral wrist pain secondary to carpal tunnel syndrome; left shoulder pain secondary to bursitis.  Will treat with NSAIDs, place and wrist splint, sling for shoulder comfort and patient will follow-up with orthopedics.  Strict return precautions given.  Patient verbalizes understanding and agrees with plan of care.      ____________________________________________   FINAL CLINICAL IMPRESSION(S) / ED DIAGNOSES  Final diagnoses:  Pain in both wrists  Bilateral carpal tunnel syndrome  Bursitis of left shoulder     ED Discharge Orders    None       Note:  This document was prepared using Dragon voice recognition software and may include unintentional dictation errors.    Paulette Blanch, MD 06/04/17 [phone]

## 2018-02-27 IMAGING — CR DG CHEST 2V
1 series · 2 of 2 positions shown · non-contrast
Comparison: None.

CLINICAL DATA: Palpitations

EXAM:
CHEST  2 VIEW

[Series 1: dg chest 2 view · 0.14mm/px · 2 of 2 slices shown]
[im 1/2]
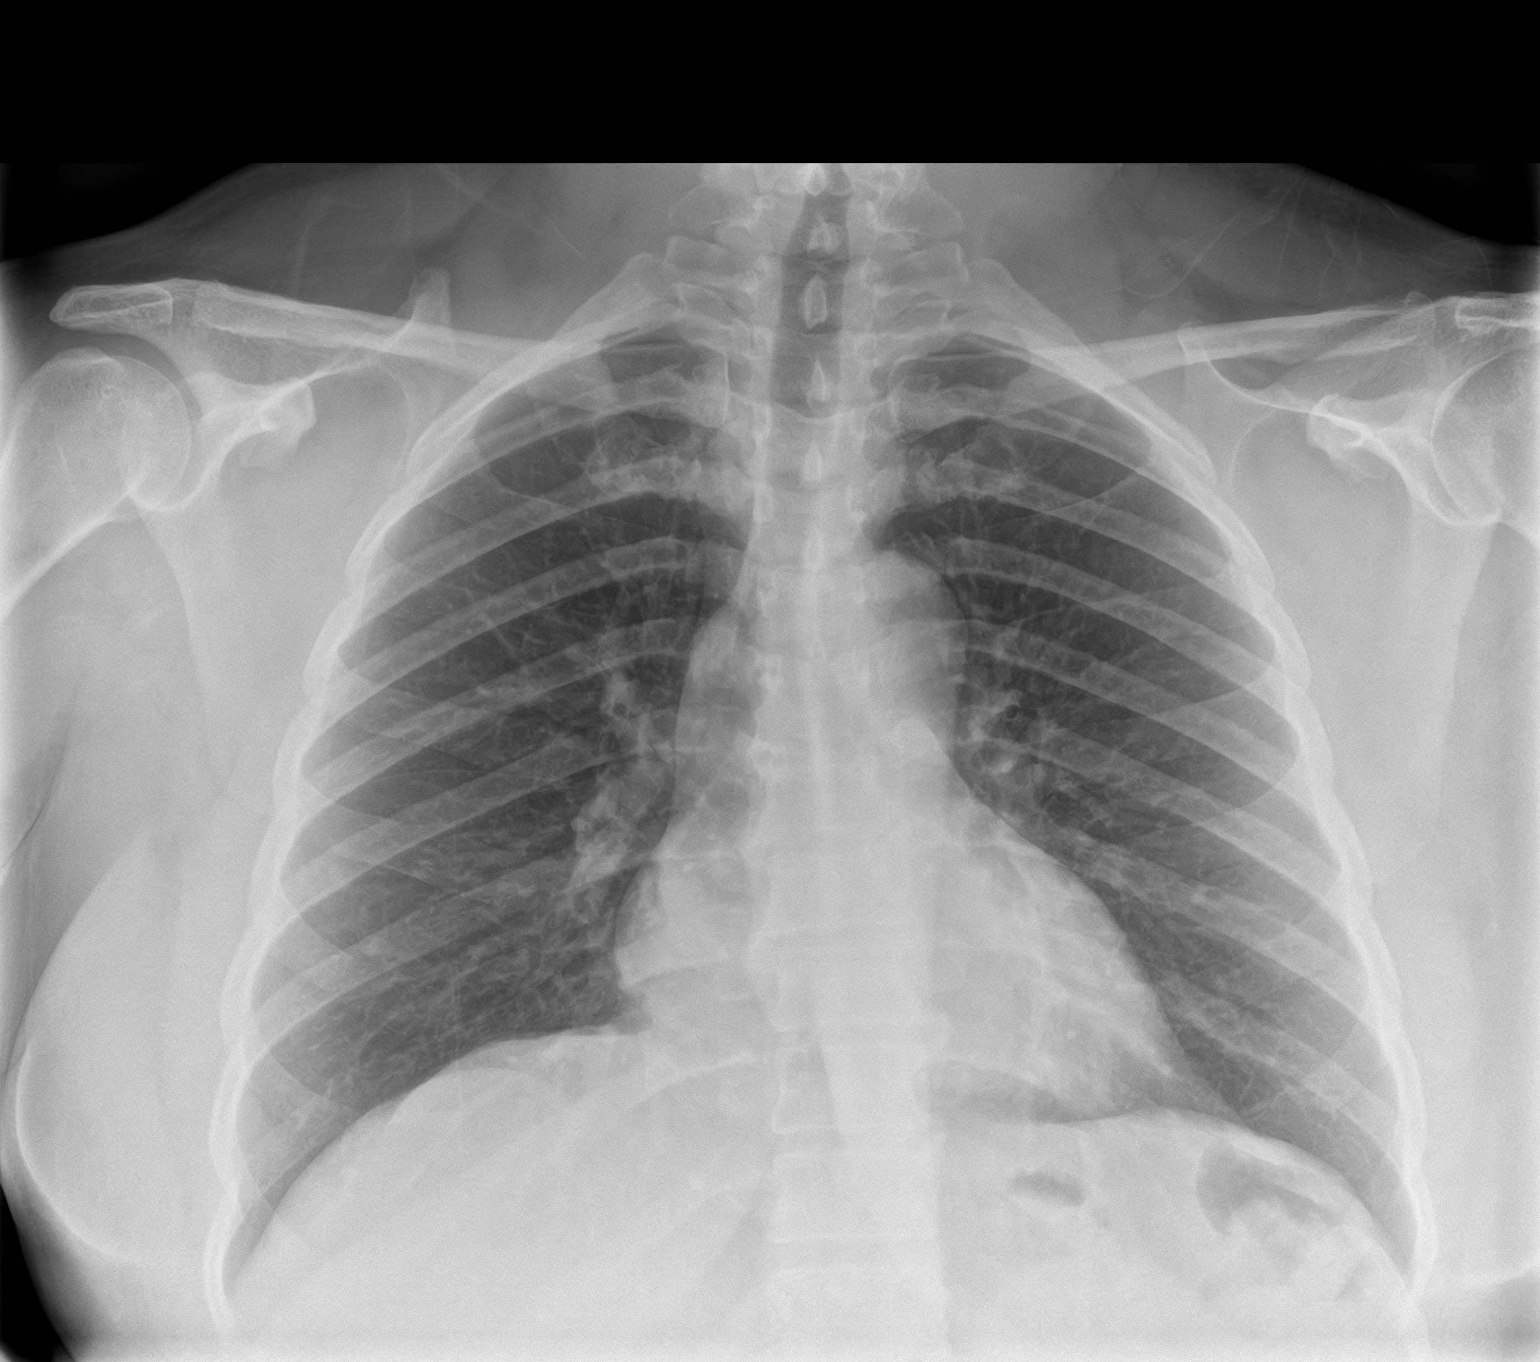
[im 2/2]
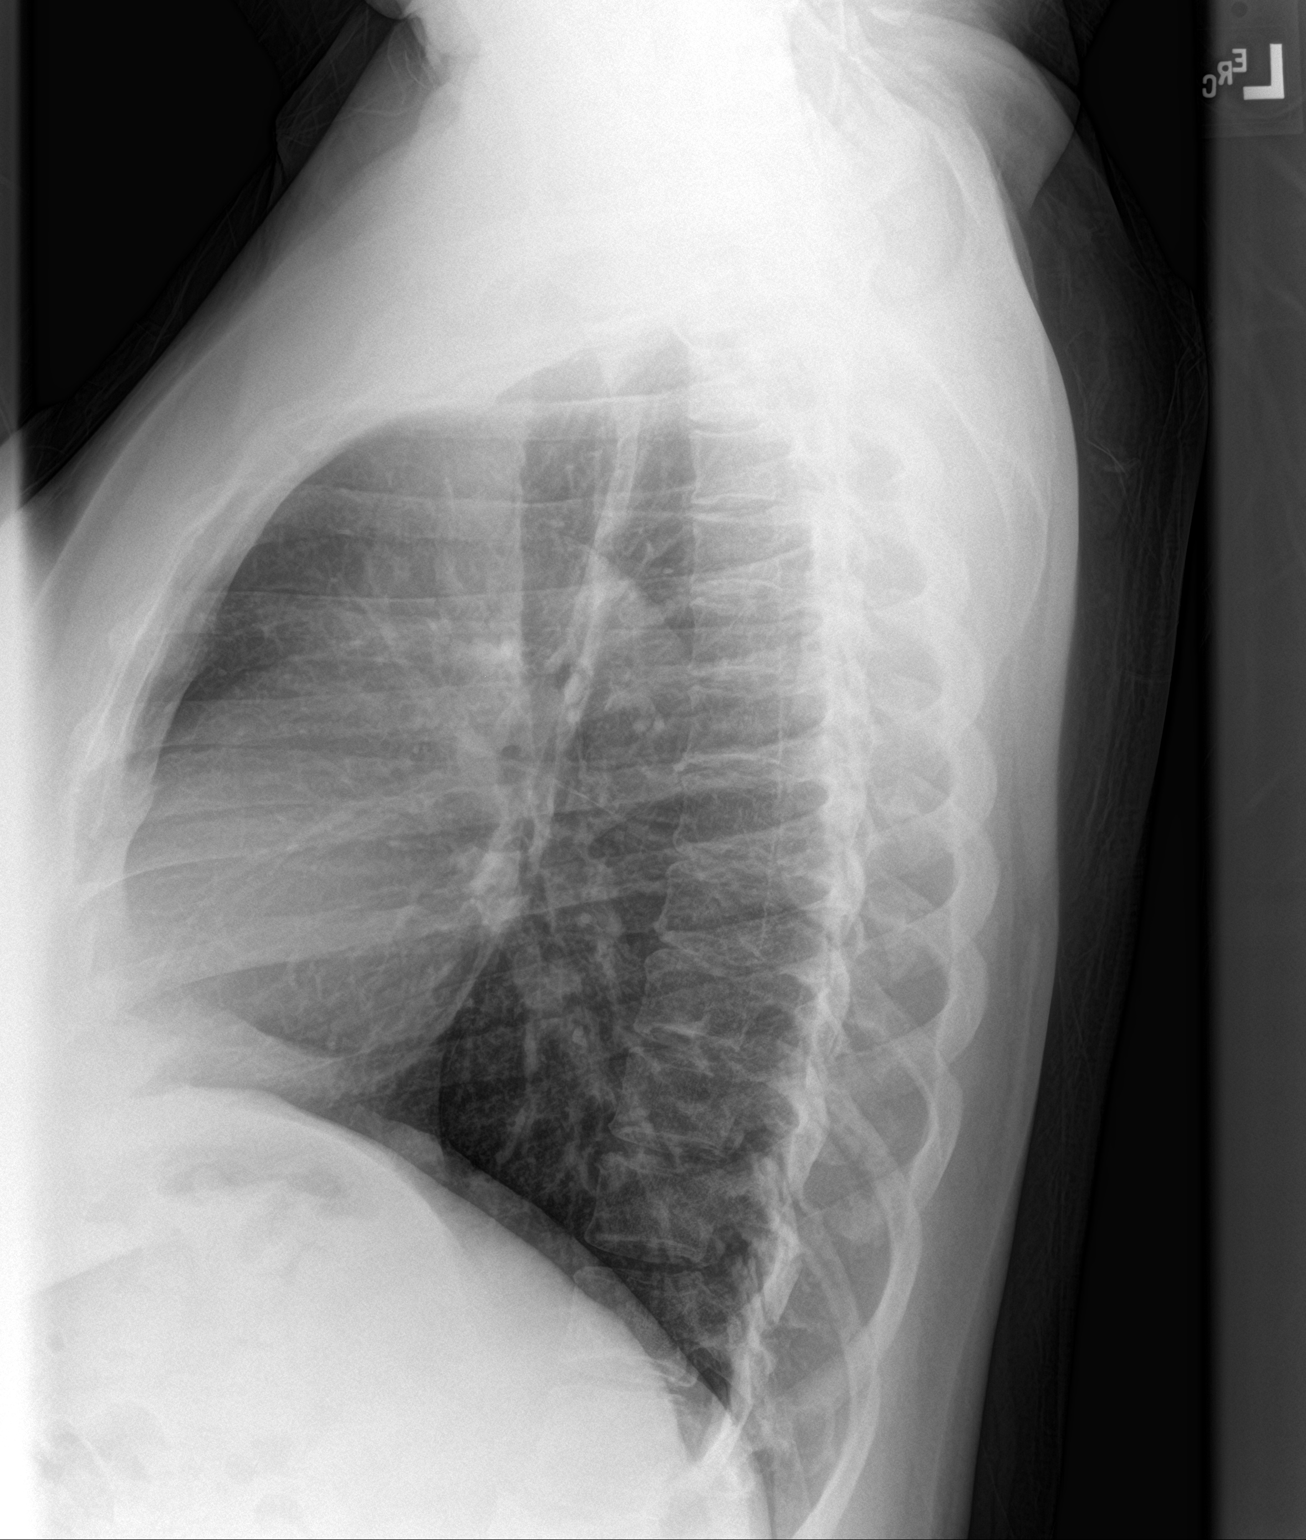

[2 of 2 positions shown; findings below may reference images not displayed]

FINDINGS: The heart size and mediastinal contours are within normal limits.
Both lungs are clear. The visualized skeletal structures are
unremarkable.
IMPRESSION: No active cardiopulmonary disease.

## 2018-11-04 ENCOUNTER — Other Ambulatory Visit: Payer: Self-pay

## 2018-11-04 ENCOUNTER — Encounter: Payer: Self-pay | Admitting: Emergency Medicine

## 2018-11-04 DIAGNOSIS — I10 Essential (primary) hypertension: Secondary | ICD-10-CM | POA: Diagnosis not present

## 2018-11-04 DIAGNOSIS — D259 Leiomyoma of uterus, unspecified: Secondary | ICD-10-CM | POA: Diagnosis not present

## 2018-11-04 DIAGNOSIS — Z79899 Other long term (current) drug therapy: Secondary | ICD-10-CM | POA: Insufficient documentation

## 2018-11-04 DIAGNOSIS — R102 Pelvic and perineal pain: Secondary | ICD-10-CM | POA: Diagnosis not present

## 2018-11-04 DIAGNOSIS — R1030 Lower abdominal pain, unspecified: Secondary | ICD-10-CM | POA: Diagnosis present

## 2018-11-04 LAB — URINALYSIS, COMPLETE (UACMP) WITH MICROSCOPIC
Bacteria, UA: NONE SEEN
Bilirubin Urine: NEGATIVE
Glucose, UA: NEGATIVE mg/dL
Hgb urine dipstick: NEGATIVE
Ketones, ur: NEGATIVE mg/dL
Leukocytes,Ua: NEGATIVE
Nitrite: NEGATIVE
Protein, ur: 30 mg/dL — AB
Specific Gravity, Urine: 1.023 (ref 1.005–1.030)
pH: 5 (ref 5.0–8.0)

## 2018-11-04 LAB — CBC
HCT: 34.5 % — ABNORMAL LOW (ref 36.0–46.0)
Hemoglobin: 11.3 g/dL — ABNORMAL LOW (ref 12.0–15.0)
MCH: 25.2 pg — ABNORMAL LOW (ref 26.0–34.0)
MCHC: 32.8 g/dL (ref 30.0–36.0)
MCV: 77 fL — ABNORMAL LOW (ref 80.0–100.0)
Platelets: 381 10*3/uL (ref 150–400)
RBC: 4.48 MIL/uL (ref 3.87–5.11)
RDW: 14.4 % (ref 11.5–15.5)
WBC: 10.2 10*3/uL (ref 4.0–10.5)
nRBC: 0 % (ref 0.0–0.2)

## 2018-11-04 LAB — COMPREHENSIVE METABOLIC PANEL
ALT: 17 U/L (ref 0–44)
AST: 16 U/L (ref 15–41)
Albumin: 4 g/dL (ref 3.5–5.0)
Alkaline Phosphatase: 51 U/L (ref 38–126)
Anion gap: 8 (ref 5–15)
BUN: 13 mg/dL (ref 6–20)
CO2: 27 mmol/L (ref 22–32)
Calcium: 9 mg/dL (ref 8.9–10.3)
Chloride: 104 mmol/L (ref 98–111)
Creatinine, Ser: 0.75 mg/dL (ref 0.44–1.00)
GFR calc Af Amer: >60 mL/min (ref >60)
GFR calc non Af Amer: >60 mL/min (ref >60)
Glucose, Bld: 90 mg/dL (ref 70–99)
Potassium: 3.4 mmol/L — ABNORMAL LOW (ref 3.5–5.1)
Sodium: 139 mmol/L (ref 135–145)
Total Bilirubin: 0.5 mg/dL (ref 0.3–1.2)
Total Protein: 7.5 g/dL (ref 6.5–8.1)

## 2018-11-04 LAB — POCT PREGNANCY, URINE: Preg Test, Ur: NEGATIVE

## 2018-11-04 LAB — LIPASE, BLOOD: Lipase: 38 U/L (ref 11–51)

## 2018-11-04 NOTE — ED Triage Notes (Signed)
Pt arrived via POV with reports lower abdominal pain x 1 week, pt states the pain has worsened over the past week. Pt denies any N/V/D at this time.   Pt reports the pain as sore.

## 2018-11-05 ENCOUNTER — Emergency Department
Admission: EM | Admit: 2018-11-05 | Discharge: 2018-11-05 | Disposition: A | Discharge status: Home / Self Care | Payer: BC Managed Care – PPO | Attending: Emergency Medicine | Admitting: Emergency Medicine

## 2018-11-05 DIAGNOSIS — D219 Benign neoplasm of connective and other soft tissue, unspecified: Secondary | ICD-10-CM

## 2018-11-05 DIAGNOSIS — N9489 Other specified conditions associated with female genital organs and menstrual cycle: Secondary | ICD-10-CM

## 2018-11-05 MED ORDER — KETOROLAC TROMETHAMINE 60 MG/2ML IM SOLN
60.0000 mg | Freq: Once | INTRAMUSCULAR | Status: AC
  Administered 2018-11-05: 60 mg via INTRAMUSCULAR
  Filled 2018-11-05: qty 2

## 2018-11-05 MED ORDER — IBUPROFEN 600 MG PO TABS: 600.0000 mg | ORAL_TABLET | Freq: Three times a day (TID) | ORAL | 0 refills | Status: AC | PRN

## 2018-11-05 MED ORDER — ONDANSETRON 4 MG PO TBDP: 4.0000 mg | ORAL_TABLET | Freq: Three times a day (TID) | ORAL | 0 refills | Status: AC | PRN

## 2018-11-05 MED ORDER — HYDROCODONE-ACETAMINOPHEN 5-325 MG PO TABS: 1.0000 | ORAL_TABLET | Freq: Four times a day (QID) | ORAL | 0 refills | Status: AC | PRN

## 2018-11-05 NOTE — Discharge Instructions (Signed)
Call Dr. Gerrit Friends to discuss your Er visit  I'd recommend seeing your OB WITHIN THE NEXT WEEK for follow-up and possible ultrasound

## 2018-11-05 NOTE — ED Notes (Signed)
Pt sitting in lobby, mask on with no distress noted; pt updated on wait time

## 2018-11-05 NOTE — ED Provider Notes (Signed)
[facility] Emergency Department Provider Note  ____________________________________________   First MD Initiated Contact with Patient 11/05/18 [phone]     (approximate)  I have reviewed the triage vital signs and the nursing notes.   HISTORY  Chief Complaint Abdominal Pain    HPI Stacie Stewart is a 48 y.o. female with past medical history as below here with abdominal pain.  The patient states that she has a chronic history of lower abdominal pain secondary to known, severe uterine fibroids.  She reports that she finished her period last week, and since that has had aching, throbbing, lower abdominal pain.  The pain began gradually and has been intermittent.  She denies any adnexal pain.  No fevers or chills.  She said no further vaginal bleeding.  She has not followed up with an OB recently.  She is not birth control.  She is not taking anything for pain.        Past Medical History:  Diagnosis Date   Allergy    Anxiety    Depression    Fibromyalgia    Hypercholesteremia    Hypertension    Panic attacks     There are no active problems to display for this patient.   Past Surgical History:  Procedure Laterality Date   ABDOMINAL SURGERY     DENTAL SURGERY     thyroid     pt has not had thyroid surgery    Prior to Admission medications   Medication Sig Start Date End Date Taking? Authorizing Provider  brompheniramine-pseudoephedrine-DM 30-2-10 MG/5ML syrup Take 5 mLs by mouth 4 (four) times daily as needed. 03/29/17   Sable Feil, PA-C  cetirizine (ZYRTEC) 10 MG tablet Take 1 tablet (10 mg total) by mouth daily as needed for allergies. 03/19/17   Laban Emperor, PA-C  cholecalciferol (VITAMIN D) 1000 units tablet Take 1,000 Units by mouth daily.    [provider]  cyanocobalamin 500 MCG tablet Take 500 mcg by mouth daily.    [provider]  cyclobenzaprine (FLEXERIL) 5 MG tablet Take 1-2 tablets 3 times daily as  needed 03/19/17   Laban Emperor, PA-C  diltiazem (CARDIZEM) 120 MG tablet Take 1 tablet (120 mg total) by mouth daily. 03/19/17   Laban Emperor, PA-C  DULoxetine (CYMBALTA) 30 MG capsule Take 1 capsule (30 mg total) by mouth daily. 03/19/17   Laban Emperor, PA-C  ferrous sulfate 325 (65 FE) MG tablet Take 1 tablet (325 mg total) by mouth daily. 09/18/16 09/18/17  Gregor Hams, MD  HYDROcodone-acetaminophen (NORCO/VICODIN) 5-325 MG tablet Take 1-2 tablets by mouth every 6 (six) hours as needed for moderate pain or severe pain. 11/05/18 11/05/19  Duffy Bruce, MD  ibuprofen (ADVIL) 600 MG tablet Take 1 tablet (600 mg total) by mouth every 8 (eight) hours as needed for moderate pain. 11/05/18   Duffy Bruce, MD  naproxen (NAPROSYN) 500 MG tablet Take 1 tablet (500 mg total) by mouth 2 (two) times daily with a meal. 06/04/17   Paulette Blanch, MD  Omega-3 Fatty Acids (FISH OIL) 1000 MG CAPS Take 1 capsule by mouth daily.    [provider]  ondansetron (ZOFRAN ODT) 4 MG disintegrating tablet Take 1 tablet (4 mg total) by mouth every 8 (eight) hours as needed for nausea or vomiting. 11/05/18   Duffy Bruce, MD  triamterene-hydrochlorothiazide (MAXZIDE-25) 37.5-25 MG tablet Take 1 tablet by mouth daily.    [provider]    Allergies Patient has no  known allergies.  No family history on file.  Social History Social History   Tobacco Use   Smoking status: Never Smoker   Smokeless tobacco: Never Used  Substance Use Topics   Alcohol use: No   Drug use: No    Review of Systems  Review of Systems  Constitutional: Negative for fatigue and fever.  HENT: Negative for congestion and sore throat.   Eyes: Negative for visual disturbance.  Respiratory: Negative for cough and shortness of breath.   Cardiovascular: Negative for chest pain.  Gastrointestinal: Positive for abdominal pain. Negative for diarrhea, nausea and vomiting.  Genitourinary: Positive for pelvic pain.  Negative for flank pain.  Musculoskeletal: Negative for back pain and neck pain.  Skin: Negative for rash and wound.  Neurological: Negative for weakness.  All other systems reviewed and are negative.    ____________________________________________  PHYSICAL EXAM:      VITAL SIGNS: ED Triage Vitals  Enc Vitals Group     BP 11/04/18 2228 131/79     Pulse Rate 11/04/18 2228 78     Resp 11/04/18 2228 16     Temp 11/04/18 2228 98.5 F (36.9 C)     Temp Source 11/04/18 2228 Oral     SpO2 11/04/18 2228 98 %     Weight 11/04/18 2229 176 lb (79.8 kg)     Height 11/04/18 2229 5\' 3"  (1.6 m)     Head Circumference --      Peak Flow --      Pain Score 11/04/18 2231 5     Pain Loc --      Pain Edu? --      Excl. in [place]? --      Physical Exam Vitals signs and nursing note reviewed.  Constitutional:      General: She is not in acute distress.    Appearance: She is well-developed.  HENT:     Head: Normocephalic and atraumatic.  Eyes:     Conjunctiva/sclera: Conjunctivae normal.  Neck:     Musculoskeletal: Neck supple.  Cardiovascular:     Rate and Rhythm: Normal rate and regular rhythm.     Heart sounds: Normal heart sounds. No murmur. No friction rub.  Pulmonary:     Effort: Pulmonary effort is normal. No respiratory distress.     Breath sounds: Normal breath sounds. No wheezing or rales.  Abdominal:     General: There is no distension.     Palpations: Abdomen is soft.     Tenderness: There is abdominal tenderness in the suprapubic area.  Genitourinary:    Comments: Palpable, firm uterus at approximately 12 to 13 weeks.  Mild tenderness, no guarding.  No adnexal pain or fullness. Skin:    General: Skin is warm.     Capillary Refill: Capillary refill takes less than 2 seconds.  Neurological:     Mental Status: She is alert and oriented to person, place, and time.     Motor: No abnormal muscle tone.       ____________________________________________   LABS (all labs  ordered are listed, but only abnormal results are displayed)  Labs Reviewed  COMPREHENSIVE METABOLIC PANEL - Abnormal; Notable for the following components:      Result Value   Potassium 3.4 (*)    All other components within normal limits  CBC - Abnormal; Notable for the following components:   Hemoglobin 11.3 (*)    HCT 34.5 (*)    MCV 77.0 (*)    MCH 25.2 (*)  All other components within normal limits  URINALYSIS, COMPLETE (UACMP) WITH MICROSCOPIC - Abnormal; Notable for the following components:   Color, Urine YELLOW (*)    APPearance CLEAR (*)    Protein, ur 30 (*)    All other components within normal limits  LIPASE, BLOOD  POC URINE PREG, ED  POCT PREGNANCY, URINE    ____________________________________________  EKG: None ________________________________________  RADIOLOGY All imaging, including plain films, CT scans, and ultrasounds, independently reviewed by me, and interpretations confirmed via formal radiology reads.  ED MD interpretation:   None  Official radiology report(s): No results found.  ____________________________________________  PROCEDURES   Procedure(s) performed (including Critical Care):  Procedures  ____________________________________________  INITIAL IMPRESSION / MDM / [place] / ED COURSE  As part of my medical decision making, I reviewed the following data within the electronic MEDICAL RECORD NUMBER Notes from prior ED visits and [place] Controlled Substance Database      *Eshe Bainum was evaluated in Emergency Department on 11/05/2018 for the symptoms described in the history of present illness. She was evaluated in the context of the global COVID-19 pandemic, which necessitated consideration that the patient might be at risk for infection with the SARS-CoV-2 virus that causes COVID-19. Institutional protocols and algorithms that pertain to the evaluation of patients at risk for COVID-19 are in a state of rapid change based on  information released by regulatory bodies including the CDC and federal and state organizations. These policies and algorithms were followed during the patient's care in the ED.  Some ED evaluations and interventions may be delayed as a result of limited staffing during the pandemic.*      Medical Decision Making: 48 year old female here with lower abdominal pain.  She has known uterine fibroids with firm uterus on exam.  No evidence of infection.  She is otherwise well-appearing.  Her lab work shows baseline likely iron deficient anemia for which she is already on supplements.  UA is without UTI.  She has no right lower quadrant tenderness or signs of appendicitis.  Given that this is a known, chronic issue, will treat symptomatically.  Discussed possible imaging, and given her otherwise well appearance I do not feel this is emergently indicated and she would like to follow-up as an outpatient.  Return precautions given.  ____________________________________________  FINAL CLINICAL IMPRESSION(S) / ED DIAGNOSES  Final diagnoses:  Uterine pain  Fibroid     MEDICATIONS GIVEN DURING THIS VISIT:  Medications  ketorolac (TORADOL) injection 60 mg (60 mg Intramuscular Given 11/05/18 0605)     ED Discharge Orders         Ordered    ibuprofen (ADVIL) 600 MG tablet  Every 8 hours PRN     11/05/18 0700    HYDROcodone-acetaminophen (NORCO/VICODIN) 5-325 MG tablet  Every 6 hours PRN     11/05/18 0700    ondansetron (ZOFRAN ODT) 4 MG disintegrating tablet  Every 8 hours PRN     11/05/18 0700           Note:  This document was prepared using Dragon voice recognition software and may include unintentional dictation errors.   Duffy Bruce, MD 11/05/18 [phone]

## 2020-03-15 ENCOUNTER — Other Ambulatory Visit: Payer: BC Managed Care – PPO

## 2020-03-30 ENCOUNTER — Other Ambulatory Visit: Payer: BC Managed Care – PPO

## 2020-04-05 ENCOUNTER — Other Ambulatory Visit: Payer: Self-pay

## 2020-04-06 ENCOUNTER — Other Ambulatory Visit: Payer: BC Managed Care – PPO

## 2022-06-12 ENCOUNTER — Ambulatory Visit: Payer: Self-pay | Admitting: Diagnostic Neuroimaging
